# Patient Record
Sex: Male | Born: 1990
Health system: Southern US, Community
[De-identification: ages and names within clinical notes are randomized; demographics above are authoritative.]

---
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
## 2010-02-19 ENCOUNTER — Emergency Department (HOSPITAL_COMMUNITY)
Admission: EM | Admit: 2010-02-19 | Discharge: 2010-02-19 | Payer: Self-pay | Source: Home / Self Care | Admitting: Emergency Medicine
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
## 2012-05-25 ENCOUNTER — Emergency Department (HOSPITAL_COMMUNITY): Payer: No Typology Code available for payment source | Admitting: Anesthesiology

## 2012-05-25 ENCOUNTER — Encounter (HOSPITAL_COMMUNITY): Admission: EM | Disposition: A | Discharge status: Home / Self Care | Payer: Self-pay | Source: Home / Self Care

## 2012-05-25 ENCOUNTER — Encounter (HOSPITAL_COMMUNITY): Payer: Self-pay | Admitting: Physical Medicine and Rehabilitation

## 2012-05-25 ENCOUNTER — Inpatient Hospital Stay (HOSPITAL_COMMUNITY)
Admission: EM | Admit: 2012-05-25 | Discharge: 2012-05-30 | DRG: 330 | Disposition: A | Discharge status: Home / Self Care | Payer: No Typology Code available for payment source | Attending: General Surgery | Admitting: General Surgery

## 2012-05-25 ENCOUNTER — Emergency Department (HOSPITAL_COMMUNITY): Payer: No Typology Code available for payment source

## 2012-05-25 ENCOUNTER — Encounter (HOSPITAL_COMMUNITY): Payer: Self-pay | Admitting: Anesthesiology

## 2012-05-25 DIAGNOSIS — F17200 Nicotine dependence, unspecified, uncomplicated: Secondary | ICD-10-CM | POA: Diagnosis present

## 2012-05-25 DIAGNOSIS — F1210 Cannabis abuse, uncomplicated: Secondary | ICD-10-CM | POA: Diagnosis present

## 2012-05-25 DIAGNOSIS — S36499A Other injury of unspecified part of small intestine, initial encounter: Principal | ICD-10-CM | POA: Diagnosis present

## 2012-05-25 DIAGNOSIS — Y92410 Unspecified street and highway as the place of occurrence of the external cause: Secondary | ICD-10-CM

## 2012-05-25 DIAGNOSIS — S36409A Unspecified injury of unspecified part of small intestine, initial encounter: Secondary | ICD-10-CM

## 2012-05-25 DIAGNOSIS — K668 Other specified disorders of peritoneum: Secondary | ICD-10-CM

## 2012-05-25 DIAGNOSIS — D62 Acute posthemorrhagic anemia: Secondary | ICD-10-CM | POA: Diagnosis not present

## 2012-05-25 HISTORY — PX: LAPAROTOMY: SHX154

## 2012-05-25 LAB — URINALYSIS, ROUTINE W REFLEX MICROSCOPIC
Nitrite: NEGATIVE
Specific Gravity, Urine: 1.03 (ref 1.005–1.030)
Urobilinogen, UA: 0.2 mg/dL (ref 0.0–1.0)
pH: 5.5 (ref 5.0–8.0)

## 2012-05-25 LAB — CBC WITH DIFFERENTIAL/PLATELET
Basophils Absolute: 0 10*3/uL (ref 0.0–0.1)
Basophils Relative: 0 % (ref 0–1)
Eosinophils Absolute: 0 10*3/uL (ref 0.0–0.7)
Eosinophils Relative: 0 % (ref 0–5)
MCH: 30.8 pg (ref 26.0–34.0)
MCHC: 36.4 g/dL — ABNORMAL HIGH (ref 30.0–36.0)
MCV: 84.5 fL (ref 78.0–100.0)
Platelets: 216 10*3/uL (ref 150–400)
RDW: 12.3 % (ref 11.5–15.5)

## 2012-05-25 LAB — TYPE AND SCREEN
ABO/RH(D): A NEG
Antibody Screen: NEGATIVE

## 2012-05-25 LAB — URINE MICROSCOPIC-ADD ON

## 2012-05-25 LAB — COMPREHENSIVE METABOLIC PANEL
ALT: 16 U/L (ref 0–53)
Albumin: 4.9 g/dL (ref 3.5–5.2)
Calcium: 9.5 mg/dL (ref 8.4–10.5)
GFR calc Af Amer: >90 mL/min (ref >90)
Glucose, Bld: 112 mg/dL — ABNORMAL HIGH (ref 70–99)
Sodium: 137 mEq/L (ref 135–145)
Total Protein: 7.6 g/dL (ref 6.0–8.3)

## 2012-05-25 LAB — ABO/RH: ABO/RH(D): A NEG

## 2012-05-25 SURGERY — LAPAROTOMY, EXPLORATORY
Anesthesia: General | Site: Abdomen | Wound class: Clean Contaminated

## 2012-05-25 MED ORDER — DIPHENHYDRAMINE HCL 50 MG/ML IJ SOLN
12.5000 mg | Freq: Four times a day (QID) | INTRAMUSCULAR | Status: DC | PRN
  Filled 2012-05-25: qty 0.25

## 2012-05-25 MED ORDER — NEOSTIGMINE METHYLSULFATE 1 MG/ML IJ SOLN
INTRAMUSCULAR | Status: DC | PRN
  Administered 2012-05-25: 3 mg via INTRAVENOUS

## 2012-05-25 MED ORDER — NALOXONE HCL 0.4 MG/ML IJ SOLN
0.4000 mg | INTRAMUSCULAR | Status: DC | PRN
  Filled 2012-05-25: qty 1

## 2012-05-25 MED ORDER — SODIUM CHLORIDE 0.9 % IJ SOLN: 9.0000 mL | INTRAMUSCULAR | Status: DC | PRN

## 2012-05-25 MED ORDER — MIDAZOLAM HCL 5 MG/5ML IJ SOLN
INTRAMUSCULAR | Status: DC | PRN
  Administered 2012-05-25: 2 mg via INTRAVENOUS

## 2012-05-25 MED ORDER — 0.9 % SODIUM CHLORIDE (POUR BTL) OPTIME
TOPICAL | Status: DC | PRN
  Administered 2012-05-25: 3000 mL

## 2012-05-25 MED ORDER — SUFENTANIL CITRATE 50 MCG/ML IV SOLN
INTRAVENOUS | Status: DC | PRN
  Administered 2012-05-25: 20 ug via INTRAVENOUS
  Administered 2012-05-25: 10 ug via INTRAVENOUS

## 2012-05-25 MED ORDER — HYDROCODONE-ACETAMINOPHEN 5-325 MG PO TABS
2.0000 | ORAL_TABLET | Freq: Once | ORAL | Status: AC
  Administered 2012-05-25: 2 via ORAL
  Filled 2012-05-25: qty 2

## 2012-05-25 MED ORDER — POVIDONE-IODINE 10 % EX OINT
TOPICAL_OINTMENT | CUTANEOUS | Status: DC | PRN
  Administered 2012-05-25: 1 via TOPICAL

## 2012-05-25 MED ORDER — PROPOFOL 10 MG/ML IV BOLUS
INTRAVENOUS | Status: DC | PRN
  Administered 2012-05-25: 200 mg via INTRAVENOUS

## 2012-05-25 MED ORDER — PANTOPRAZOLE SODIUM 40 MG IV SOLR
40.0000 mg | INTRAVENOUS | Status: DC
  Administered 2012-05-26 – 2012-05-28 (×4): 40 mg via INTRAVENOUS
  Filled 2012-05-25 (×6): qty 40

## 2012-05-25 MED ORDER — ONDANSETRON HCL 4 MG/2ML IJ SOLN
4.0000 mg | Freq: Four times a day (QID) | INTRAMUSCULAR | Status: DC | PRN
  Filled 2012-05-25: qty 2

## 2012-05-25 MED ORDER — KCL IN DEXTROSE-NACL 20-5-0.45 MEQ/L-%-% IV SOLN
INTRAVENOUS | Status: AC
  Administered 2012-05-26: 1000 mL
  Filled 2012-05-25: qty 1000

## 2012-05-25 MED ORDER — IOHEXOL 300 MG/ML  SOLN
100.0000 mL | Freq: Once | INTRAMUSCULAR | Status: AC | PRN
  Administered 2012-05-25: 100 mL via INTRAVENOUS

## 2012-05-25 MED ORDER — DIPHENHYDRAMINE HCL 12.5 MG/5ML PO ELIX
12.5000 mg | ORAL_SOLUTION | Freq: Four times a day (QID) | ORAL | Status: DC | PRN
  Filled 2012-05-25: qty 5

## 2012-05-25 MED ORDER — ONDANSETRON HCL 4 MG/2ML IJ SOLN
4.0000 mg | Freq: Four times a day (QID) | INTRAMUSCULAR | Status: DC | PRN
  Administered 2012-05-26: 4 mg via INTRAVENOUS
  Filled 2012-05-25: qty 2

## 2012-05-25 MED ORDER — MORPHINE SULFATE (PF) 1 MG/ML IV SOLN
INTRAVENOUS | Status: AC
  Filled 2012-05-25: qty 25

## 2012-05-25 MED ORDER — IBUPROFEN 400 MG PO TABS
800.0000 mg | ORAL_TABLET | Freq: Once | ORAL | Status: AC
  Administered 2012-05-25: 800 mg via ORAL
  Filled 2012-05-25: qty 2

## 2012-05-25 MED ORDER — SODIUM CHLORIDE 0.9 % IV BOLUS (SEPSIS)
1000.0000 mL | Freq: Once | INTRAVENOUS | Status: AC
Start: 2012-05-25 — End: 2012-05-25
  Administered 2012-05-25: 1000 mL via INTRAVENOUS

## 2012-05-25 MED ORDER — ROCURONIUM BROMIDE 100 MG/10ML IV SOLN
INTRAVENOUS | Status: DC | PRN
  Administered 2012-05-25: 10 mg via INTRAVENOUS
  Administered 2012-05-25: 30 mg via INTRAVENOUS

## 2012-05-25 MED ORDER — OXYCODONE HCL 5 MG PO TABS: 5.0000 mg | ORAL_TABLET | Freq: Once | ORAL | Status: DC | PRN

## 2012-05-25 MED ORDER — ONDANSETRON HCL 4 MG PO TABS: 4.0000 mg | ORAL_TABLET | Freq: Four times a day (QID) | ORAL | Status: DC | PRN

## 2012-05-25 MED ORDER — LIDOCAINE HCL 4 % MT SOLN
OROMUCOSAL | Status: DC | PRN
  Administered 2012-05-25: 4 mL via TOPICAL

## 2012-05-25 MED ORDER — GLYCOPYRROLATE 0.2 MG/ML IJ SOLN
INTRAMUSCULAR | Status: DC | PRN
  Administered 2012-05-25: 0.4 mg via INTRAVENOUS

## 2012-05-25 MED ORDER — LIDOCAINE HCL (CARDIAC) 20 MG/ML IV SOLN
INTRAVENOUS | Status: DC | PRN
  Administered 2012-05-25: 100 mg via INTRAVENOUS

## 2012-05-25 MED ORDER — OXYCODONE HCL 5 MG/5ML PO SOLN: 5.0000 mg | Freq: Once | ORAL | Status: DC | PRN

## 2012-05-25 MED ORDER — LACTATED RINGERS IV SOLN
INTRAVENOUS | Status: DC | PRN
  Administered 2012-05-25 (×2): via INTRAVENOUS

## 2012-05-25 MED ORDER — SUCCINYLCHOLINE CHLORIDE 20 MG/ML IJ SOLN
INTRAMUSCULAR | Status: DC | PRN
  Administered 2012-05-25: 140 mg via INTRAVENOUS

## 2012-05-25 MED ORDER — ONDANSETRON HCL 4 MG/2ML IJ SOLN
INTRAMUSCULAR | Status: DC | PRN
  Administered 2012-05-25: 4 mg via INTRAVENOUS

## 2012-05-25 MED ORDER — HYDROMORPHONE HCL PF 1 MG/ML IJ SOLN: 0.2500 mg | INTRAMUSCULAR | Status: DC | PRN

## 2012-05-25 MED ORDER — POVIDONE-IODINE 10 % EX OINT
TOPICAL_OINTMENT | CUTANEOUS | Status: AC
  Filled 2012-05-25: qty 28.35

## 2012-05-25 MED ORDER — MORPHINE SULFATE (PF) 1 MG/ML IV SOLN
INTRAVENOUS | Status: DC
  Administered 2012-05-25: 23:00:00 via INTRAVENOUS
  Administered 2012-05-26: 9 mg via INTRAVENOUS
  Administered 2012-05-26: 04:00:00 via INTRAVENOUS
  Administered 2012-05-26: 20.94 mg via INTRAVENOUS
  Administered 2012-05-26: 15:00:00 via INTRAVENOUS
  Administered 2012-05-26: 14.56 mg via INTRAVENOUS
  Administered 2012-05-26: 10.5 mg via INTRAVENOUS
  Administered 2012-05-26: 19.5 mg via INTRAVENOUS
  Administered 2012-05-27: 4.5 mg via INTRAVENOUS
  Administered 2012-05-27 (×2): via INTRAVENOUS
  Administered 2012-05-27: 10.5 mg via INTRAVENOUS
  Administered 2012-05-27: 3.4 mg via INTRAVENOUS
  Administered 2012-05-27: 13.5 mg via INTRAVENOUS
  Administered 2012-05-28: 13:00:00 via INTRAVENOUS
  Administered 2012-05-28: 1.5 mg via INTRAVENOUS
  Administered 2012-05-28: 11.74 mg via INTRAVENOUS
  Administered 2012-05-28: 6 mg via INTRAVENOUS
  Administered 2012-05-28: 23:00:00 via INTRAVENOUS
  Administered 2012-05-28: 24 mg via INTRAVENOUS
  Administered 2012-05-29: 13.41 mg via INTRAVENOUS
  Filled 2012-05-25 (×8): qty 25

## 2012-05-25 MED ORDER — ONDANSETRON HCL 4 MG/2ML IJ SOLN
4.0000 mg | Freq: Once | INTRAMUSCULAR | Status: AC
  Administered 2012-05-25: 4 mg via INTRAVENOUS
  Filled 2012-05-25: qty 2

## 2012-05-25 MED ORDER — DEXAMETHASONE SODIUM PHOSPHATE 4 MG/ML IJ SOLN
INTRAMUSCULAR | Status: DC | PRN
  Administered 2012-05-25: 4 mg via INTRAVENOUS

## 2012-05-25 MED ORDER — METOCLOPRAMIDE HCL 5 MG/ML IJ SOLN: 10.0000 mg | Freq: Once | INTRAMUSCULAR | Status: DC | PRN

## 2012-05-25 MED ORDER — ENOXAPARIN SODIUM 40 MG/0.4ML ~~LOC~~ SOLN
40.0000 mg | SUBCUTANEOUS | Status: DC
  Administered 2012-05-26 – 2012-05-30 (×5): 40 mg via SUBCUTANEOUS
  Filled 2012-05-25 (×5): qty 0.4

## 2012-05-25 MED ORDER — SODIUM CHLORIDE 0.9 % IV SOLN
1.0000 g | Freq: Once | INTRAVENOUS | Status: AC
  Administered 2012-05-25: 1 g via INTRAVENOUS
  Filled 2012-05-25: qty 1

## 2012-05-25 MED ORDER — KCL IN DEXTROSE-NACL 20-5-0.9 MEQ/L-%-% IV SOLN
INTRAVENOUS | Status: DC
  Administered 2012-05-26 – 2012-05-29 (×7): via INTRAVENOUS
  Filled 2012-05-25 (×7): qty 1000

## 2012-05-25 SURGICAL SUPPLY — 43 items
BLADE SURG ROTATE 9660 (MISCELLANEOUS) ×1 IMPLANT
CANISTER SUCTION 2500CC (MISCELLANEOUS) ×2 IMPLANT
CHLORAPREP W/TINT 26ML (MISCELLANEOUS) ×2 IMPLANT
CLOTH BEACON ORANGE TIMEOUT ST (SAFETY) ×2 IMPLANT
COVER SURGICAL LIGHT HANDLE (MISCELLANEOUS) ×2 IMPLANT
DRAPE LAPAROSCOPIC ABDOMINAL (DRAPES) ×2 IMPLANT
DRAPE UTILITY 15X26 W/TAPE STR (DRAPE) ×4 IMPLANT
DRAPE WARM FLUID 44X44 (DRAPE) ×2 IMPLANT
ELECT BLADE 6.5 EXT (BLADE) ×1 IMPLANT
ELECT CAUTERY BLADE 6.4 (BLADE) ×2 IMPLANT
ELECT REM PT RETURN 9FT ADLT (ELECTROSURGICAL) ×2
ELECTRODE REM PT RTRN 9FT ADLT (ELECTROSURGICAL) ×1 IMPLANT
GLOVE BIO SURGEON STRL SZ7.5 (GLOVE) ×2 IMPLANT
GLOVE BIOGEL PI IND STRL 7.0 (GLOVE) IMPLANT
GLOVE BIOGEL PI INDICATOR 7.0 (GLOVE) ×1
GLOVE SURG SS PI 7.0 STRL IVOR (GLOVE) ×1 IMPLANT
GOWN STRL NON-REIN LRG LVL3 (GOWN DISPOSABLE) ×6 IMPLANT
KIT BASIN OR (CUSTOM PROCEDURE TRAY) ×2 IMPLANT
KIT ROOM TURNOVER OR (KITS) ×2 IMPLANT
LIGASURE IMPACT 36 18CM CVD LR (INSTRUMENTS) IMPLANT
NS IRRIG 1000ML POUR BTL (IV SOLUTION) ×4 IMPLANT
PACK GENERAL/GYN (CUSTOM PROCEDURE TRAY) ×2 IMPLANT
PAD ARMBOARD 7.5X6 YLW CONV (MISCELLANEOUS) ×2 IMPLANT
RELOAD PROXIMATE 75MM BLUE (ENDOMECHANICALS) ×2 IMPLANT
RELOAD STAPLE 75 3.8 BLU REG (ENDOMECHANICALS) IMPLANT
SPECIMEN JAR LARGE (MISCELLANEOUS) ×1 IMPLANT
SPONGE GAUZE 4X4 12PLY (GAUZE/BANDAGES/DRESSINGS) ×1 IMPLANT
SPONGE LAP 18X18 X RAY DECT (DISPOSABLE) IMPLANT
STAPLER GUN LINEAR PROX 60 (STAPLE) ×1 IMPLANT
STAPLER PROXIMATE 75MM BLUE (STAPLE) ×1 IMPLANT
STAPLER VISISTAT 35W (STAPLE) ×2 IMPLANT
SUCTION POOLE TIP (SUCTIONS) ×2 IMPLANT
SUT PDS AB 1 TP1 96 (SUTURE) ×4 IMPLANT
SUT SILK 2 0 SH CR/8 (SUTURE) ×2 IMPLANT
SUT SILK 2 0 TIES 10X30 (SUTURE) ×2 IMPLANT
SUT SILK 3 0 SH CR/8 (SUTURE) ×2 IMPLANT
SUT SILK 3 0 TIES 10X30 (SUTURE) ×2 IMPLANT
SUT VIC AB 3-0 SH 18 (SUTURE) IMPLANT
TAPE CLOTH SURG 6X10 WHT LF (GAUZE/BANDAGES/DRESSINGS) ×1 IMPLANT
TOWEL OR 17X26 10 PK STRL BLUE (TOWEL DISPOSABLE) ×2 IMPLANT
TRAY FOLEY CATH 14FRSI W/METER (CATHETERS) ×1 IMPLANT
WATER STERILE IRR 1000ML POUR (IV SOLUTION) IMPLANT
YANKAUER SUCT BULB TIP NO VENT (SUCTIONS) ×1 IMPLANT
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
## 2012-05-25 NOTE — ED Notes (Signed)
Pt denies n/v/d 

## 2012-05-25 NOTE — ED Notes (Signed)
Pelvis stable, pt ambulatory
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
## 2012-05-25 NOTE — Transfer of Care (Signed)
Immediate Anesthesia Transfer of Care Note  Patient: Barry Hines  Procedure(s) Performed: Procedure(s): EXPLORATORY LAPAROTOMY (N/A)  Patient Location: PACU  Anesthesia Type:General  Level of Consciousness: sedated, patient cooperative and responds to stimulation  Airway & Oxygen Therapy: Patient Spontanous Breathing and Patient connected to nasal cannula oxygen  Post-op Assessment: Report given to PACU RN, Post -op Vital signs reviewed and stable and Patient moving all extremities X 4  Post vital signs: Reviewed and stable  Complications: No apparent anesthesia complications

## 2012-05-25 NOTE — ED Notes (Signed)
Patient transported to X-ray 

## 2012-05-25 NOTE — Anesthesia Preprocedure Evaluation (Signed)
Anesthesia Evaluation  Patient identified by MRN, date of birth, ID band Patient awake    Reviewed: Allergy & Precautions, H&P , NPO status , Patient's Chart, lab work & pertinent test results, reviewed documented beta blocker date and time   Airway Mallampati: II TM Distance: >3 FB Neck ROM: full    Dental   Pulmonary Current Smoker,  breath sounds clear to auscultation        Cardiovascular negative cardio ROS  Rhythm:regular     Neuro/Psych negative neurological ROS  negative psych ROS   GI/Hepatic negative GI ROS, Neg liver ROS,   Endo/Other  negative endocrine ROS  Renal/GU negative Renal ROS  negative genitourinary   Musculoskeletal   Abdominal   Peds  Hematology negative hematology ROS (+)   Anesthesia Other Findings See surgeon's H&P   Reproductive/Obstetrics negative OB ROS                           Anesthesia Physical Anesthesia Plan  ASA: II and emergent  Anesthesia Plan: General   Post-op Pain Management:    Induction: Intravenous, Rapid sequence and Cricoid pressure planned  Airway Management Planned: Oral ETT  Additional Equipment:   Intra-op Plan:   Post-operative Plan: Extubation in OR  Informed Consent: I have reviewed the patients History and Physical, chart, labs and discussed the procedure including the risks, benefits and alternatives for the proposed anesthesia with the patient or authorized representative who has indicated his/her understanding and acceptance.   Dental Advisory Given  Plan Discussed with: CRNA and Surgeon  Anesthesia Plan Comments:         Anesthesia Quick Evaluation  

## 2012-05-25 NOTE — ED Notes (Signed)
Pt diaphoretic & c/o LLQ & RLQ abd pain, Rancour, MD notified & at bedside
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
## 2012-05-25 NOTE — Anesthesia Postprocedure Evaluation (Signed)
Anesthesia Post Note  Patient: Barry Hines  Procedure(s) Performed: Procedure(s) (LRB): EXPLORATORY LAPAROTOMY (N/A)  Anesthesia type: general  Patient location: PACU  Post pain: Pain level controlled  Post assessment: Patient's Cardiovascular Status Stable  Last Vitals:  Filed Vitals:   05/25/12 2241  BP: 129/68  Pulse: 81  Temp: 36.6 C  Resp: 14    Post vital signs: Reviewed and stable  Level of consciousness: sedated  Complications: No apparent anesthesia complications

## 2012-05-25 NOTE — ED Notes (Signed)
Carolynne Edouard, MD at bedside
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
## 2012-05-25 NOTE — ED Notes (Signed)
Family at bedside. 
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
## 2012-05-26 LAB — BASIC METABOLIC PANEL
BUN: 9 mg/dL (ref 6–23)
Chloride: 102 mEq/L (ref 96–112)
GFR calc Af Amer: >90 mL/min (ref >90)
Glucose, Bld: 172 mg/dL — ABNORMAL HIGH (ref 70–99)
Potassium: 4.1 mEq/L (ref 3.5–5.1)

## 2012-05-26 LAB — CBC
HCT: 41.1 % (ref 39.0–52.0)
Hemoglobin: 14.4 g/dL (ref 13.0–17.0)
RBC: 4.86 MIL/uL (ref 4.22–5.81)
RDW: 12.4 % (ref 11.5–15.5)
WBC: 20.6 10*3/uL — ABNORMAL HIGH (ref 4.0–10.5)

## 2012-05-26 NOTE — ED Provider Notes (Signed)
Medical screening examination/treatment/procedure(s) were performed by non-physician practitioner and as supervising physician I was immediately available for consultation/collaboration.    Mayme Profeta R Tynlee Bayle, MD 05/26/12 0703 
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
## 2012-05-27 DIAGNOSIS — D62 Acute posthemorrhagic anemia: Secondary | ICD-10-CM | POA: Diagnosis not present

## 2012-05-27 DIAGNOSIS — S36409A Unspecified injury of unspecified part of small intestine, initial encounter: Secondary | ICD-10-CM

## 2012-05-27 LAB — CBC WITH DIFFERENTIAL/PLATELET
Basophils Relative: 0 % (ref 0–1)
Eosinophils Absolute: 0.1 10*3/uL (ref 0.0–0.7)
Eosinophils Relative: 1 % (ref 0–5)
HCT: 36.4 % — ABNORMAL LOW (ref 39.0–52.0)
Hemoglobin: 12.5 g/dL — ABNORMAL LOW (ref 13.0–17.0)
Lymphs Abs: 1.5 10*3/uL (ref 0.7–4.0)
MCH: 29.6 pg (ref 26.0–34.0)
MCHC: 34.3 g/dL (ref 30.0–36.0)
MCV: 86.1 fL (ref 78.0–100.0)
Monocytes Absolute: 0.8 10*3/uL (ref 0.1–1.0)
Monocytes Relative: 8 % (ref 3–12)
Neutrophils Relative %: 75 % (ref 43–77)
RBC: 4.23 MIL/uL (ref 4.22–5.81)

## 2012-05-27 LAB — BASIC METABOLIC PANEL
BUN: 9 mg/dL (ref 6–23)
CO2: 29 mEq/L (ref 19–32)
GFR calc non Af Amer: >90 mL/min (ref >90)
Glucose, Bld: 103 mg/dL — ABNORMAL HIGH (ref 70–99)
Potassium: 3.9 mEq/L (ref 3.5–5.1)
Sodium: 136 mEq/L (ref 135–145)
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
## 2012-05-27 NOTE — Progress Notes (Signed)
UR completed 
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
## 2012-05-28 ENCOUNTER — Encounter (HOSPITAL_COMMUNITY): Payer: Self-pay | Admitting: General Surgery

## 2012-05-28 LAB — CBC
MCH: 29.6 pg (ref 26.0–34.0)
MCHC: 35 g/dL (ref 30.0–36.0)
MCV: 84.6 fL (ref 78.0–100.0)
Platelets: 174 10*3/uL (ref 150–400)
RBC: 4.29 MIL/uL (ref 4.22–5.81)
RDW: 12.2 % (ref 11.5–15.5)

## 2012-05-28 NOTE — Progress Notes (Signed)
Tolerating clears, active BS, no flatus yet. Patient examined and I agree with the assessment and plan  Violeta Gelinas, MD, MPH, FACS Pager: [phone]  05/28/2012 1:55 PM
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
## 2012-05-29 MED ORDER — MORPHINE SULFATE 2 MG/ML IJ SOLN: 2.0000 mg | INTRAMUSCULAR | Status: DC | PRN

## 2012-05-29 MED ORDER — HYDROCODONE-ACETAMINOPHEN 10-325 MG PO TABS
0.5000 | ORAL_TABLET | ORAL | Status: DC | PRN
  Administered 2012-05-29: 2 via ORAL
  Filled 2012-05-29 (×2): qty 2
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
## 2012-05-29 NOTE — Progress Notes (Signed)
Lots of flatus after lunch.  Incision CDI. Patient examined and I agree with the assessment and plan  Violeta Gelinas, MD, MPH, FACS Pager: [phone]  05/29/2012 2:27 PM

## 2012-05-30 MED ORDER — HYDROCODONE-ACETAMINOPHEN 5-325 MG PO TABS
1.0000 | ORAL_TABLET | ORAL | Status: DC | PRN
Start: 2012-05-30 — End: 2022-10-21

## 2012-05-30 NOTE — Discharge Summary (Signed)
Barry Yeh, MD, MPH, FACS Pager: [phone]  

## 2012-05-30 NOTE — Progress Notes (Signed)
Eagerly anticipating lunch (BLT), inciison CDI, has taken no pain meds today Patient examined and I agree with the assessment and plan  Violeta Gelinas, MD, MPH, FACS Pager: [phone]  05/30/2012 12:33 PM
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
## 2012-05-30 NOTE — Progress Notes (Signed)
Discharge instructions given and reviewed with patient. Family member will be here soon to pick up patient. Patient will be discharged home with family.
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
## 2012-06-07 ENCOUNTER — Ambulatory Visit (INDEPENDENT_AMBULATORY_CARE_PROVIDER_SITE_OTHER): Payer: Self-pay | Admitting: Internal Medicine

## 2012-06-07 ENCOUNTER — Encounter (INDEPENDENT_AMBULATORY_CARE_PROVIDER_SITE_OTHER): Payer: Self-pay

## 2012-06-07 DIAGNOSIS — S36439D Laceration of unspecified part of small intestine, subsequent encounter: Secondary | ICD-10-CM

## 2012-06-07 DIAGNOSIS — Z5189 Encounter for other specified aftercare: Secondary | ICD-10-CM

## 2012-06-07 NOTE — Patient Instructions (Signed)
ok to shower, lifting restrictions until 5/25/14th. Will follow up prn, call with questions.
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
## 2012-07-01 ENCOUNTER — Ambulatory Visit (INDEPENDENT_AMBULATORY_CARE_PROVIDER_SITE_OTHER): Payer: Self-pay | Admitting: Surgery
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
## 2019-10-02 ENCOUNTER — Ambulatory Visit: Payer: Self-pay | Attending: Internal Medicine

## 2019-10-02 DIAGNOSIS — Z23 Encounter for immunization: Secondary | ICD-10-CM

## 2019-10-02 NOTE — Progress Notes (Signed)
   Covid-19 Vaccination Clinic  Name:  Barry Hines    MRN: [id number] DOB: 11-19-1990  10/02/2019  [name] was observed post Covid-19 immunization for 15 minutes without incident. He was provided with Vaccine Information Sheet and instruction to access the V-Safe system.   [name] was instructed to call 911 with any severe reactions post vaccine: Marland Kitchen Difficulty breathing  . Swelling of face and throat  . A fast heartbeat  . A bad rash all over body  . Dizziness and weakness   Immunizations Administered    Name Date Dose VIS Date Route   Pfizer COVID-19 Vaccine 10/02/2019 11:01 AM 0.3 mL 04/09/2018 Intramuscular   Manufacturer: [employer], [employer]   Lot: [id number]   NDC: [address]-7

## 2019-10-23 ENCOUNTER — Ambulatory Visit: Payer: Self-pay

## 2020-03-11 ENCOUNTER — Encounter (HOSPITAL_COMMUNITY): Payer: Self-pay | Admitting: Psychiatry

## 2020-03-11 ENCOUNTER — Ambulatory Visit (INDEPENDENT_AMBULATORY_CARE_PROVIDER_SITE_OTHER): Payer: No Payment, Other | Admitting: Clinical

## 2020-03-11 ENCOUNTER — Telehealth (INDEPENDENT_AMBULATORY_CARE_PROVIDER_SITE_OTHER): Payer: No Payment, Other | Admitting: Psychiatry

## 2020-03-11 ENCOUNTER — Other Ambulatory Visit (HOSPITAL_COMMUNITY): Payer: Self-pay | Admitting: Psychiatry

## 2020-03-11 ENCOUNTER — Other Ambulatory Visit: Payer: Self-pay

## 2020-03-11 DIAGNOSIS — F900 Attention-deficit hyperactivity disorder, predominantly inattentive type: Secondary | ICD-10-CM | POA: Diagnosis not present

## 2020-03-11 DIAGNOSIS — F330 Major depressive disorder, recurrent, mild: Secondary | ICD-10-CM | POA: Diagnosis not present

## 2020-03-11 DIAGNOSIS — F1220 Cannabis dependence, uncomplicated: Secondary | ICD-10-CM

## 2020-03-11 MED ORDER — ATOMOXETINE HCL 40 MG PO CAPS: 40.0000 mg | ORAL_CAPSULE | Freq: Every day | ORAL | 2 refills | Status: DC

## 2020-03-11 MED FILL — ATOMOXETINE HCL 40 MG CAPS: 40 | 30 days supply | Qty: 30 | Fill #0

## 2020-03-11 NOTE — Progress Notes (Signed)
Psychiatric Initial Adult Assessment  Virtual Visit via Video Note  I connected with Barry Hines on 03/11/20 at  2:00 PM EST by a video enabled telemedicine application and verified that I am speaking with the correct person using two identifiers.  Location: Patient: Home Provider: Clinic   I discussed the limitations of evaluation and management by telemedicine and the availability of in person appointments. The patient expressed understanding and agreed to proceed.  I provided 30  minutes of non-face-to-face time during this encounter.     Patient Identification: Barry Hines MRN:  [id number] Date of Evaluation:  03/11/2020 Referral Source: Walk in Chief Complaint:  "I need help with medication management" Visit Diagnosis:    ICD-10-CM   1. Attention deficit hyperactivity disorder (ADHD), predominantly inattentive type  F90.0 atomoxetine (STRATTERA) 40 MG capsule    History of Present Illness:  30 year old male seen today for initial psychiatric evaluation. He has a psychiatric history of ADHD and depression. He notes that he has not taken any medications since he was around the age of 51. In the past her notes that he has tried Ritalin, Concerta, and Adderall.   Today he is pleasant, cooperative, and engaged in conversation. He informed provider that he has minimal anxiety and depression however has difficulty concentrating. A GAD 7 was conducted by patients therapist today and patient scored a 2. A PHQ 9 was also conducted and patient scored a 3. Patient notes that he was anxious and depressed a few months ago because he had no job and was facing being homeless. He notes he now works  At [employer] and feels more secure and mentally stable.  Patient endorses symptoms of ADHD such as poor concentration, disorganization, forgetfulness, poor listening skills and avoidance of mentally taxing task. He also notes that he smokes marijuana 3-5 times daily. Patient  notes that his mood is overall stable but does endorse distractibility, racing thoughts, and impulsive spending on food and video games. He denies SI/HI/VAH or paranoia.  Today he is agreeable to start Strattera 40 mg to help manage symptoms of ADHD. Potential side effects of medication and risks vs benefits of treatment vs non-treatment were explained and discussed. All questions were answered. He will follow up with outpatient counseling for therapy. No other concerns noted at this time.  Associated Signs/Symptoms: Depression Symptoms:  difficulty concentrating, (Hypo) Manic Symptoms:  Distractibility, Flight of Ideas, Licensed conveyancer, Impulsivity, Anxiety Symptoms:  Denies Psychotic Symptoms:  Denies PTSD Symptoms: NA  Past Psychiatric History: Depression and ADHD  Previous Psychotropic Medications: Ritalin, concerta, and adderall  Substance Abuse History in the last 12 months:  Yes.    Consequences of Substance Abuse: NA  Past Medical History: History reviewed. No pertinent past medical history.  Past Surgical History:  Procedure Laterality Date  . LAPAROTOMY N/A 05/25/2012   Procedure: EXPLORATORY LAPAROTOMY;  Surgeon: Robyne Askew, MD;  Location: [facility] OR;  Service: General;  Laterality: N/A;    Family Psychiatric History: Mother depression   Family History: History reviewed. No pertinent family history.  Social History:   Social History   Socioeconomic History  . Marital status: Single    Spouse name: Not on file  . Number of children: Not on file  . Years of education: Not on file  . Highest education level: Not on file  Occupational History  . Not on file  Tobacco Use  . Smoking status: Current Every Day Smoker    Types: Cigarettes  .  Smokeless tobacco: Not on file  Substance and Sexual Activity  . Alcohol use: No  . Drug use: Yes    Types: Marijuana  . Sexual activity: Not on file  Other Topics Concern  . Not on file  Social History Narrative   . Not on file   Social Determinants of Health   Financial Resource Strain: Not on file  Food Insecurity: Not on file  Transportation Needs: Not on file  Physical Activity: Not on file  Stress: Not on file  Social Connections: Not on file    Additional Social History: Patient resides in [place]. He is single and has an 39 year old son. He denies alcohol and tobacco use. He endorses smoking marijuana daily. He works at [employer].   Allergies:  No Known Allergies  Metabolic Disorder Labs: No results found for: HGBA1C, MPG No results found for: PROLACTIN No results found for: CHOL, TRIG, HDL, CHOLHDL, VLDL, LDLCALC No results found for: TSH  Therapeutic Level Labs: No results found for: LITHIUM No results found for: CBMZ No results found for: VALPROATE  Current Medications: Current Outpatient Medications  Medication Sig Dispense Refill  . atomoxetine (STRATTERA) 40 MG capsule Take 1 capsule (40 mg total) by mouth daily. 30 capsule 2  . HYDROcodone-acetaminophen (NORCO) 5-325 MG per tablet Take 1-2 tablets by mouth every 4 (four) hours as needed for pain. 10 tablet 0   No current facility-administered medications for this visit.    Musculoskeletal: Strength & Muscle Tone: Unable to assess due to telehealth visit Gait & Station: Unable to assess due to telehealth visit Patient leans: N/A  Psychiatric Specialty Exam: Review of Systems  There were no vitals taken for this visit.There is no height or weight on file to calculate BMI.  General Appearance: Well Groomed  Eye Contact:  Good  Speech:  Clear and Coherent and Normal Rate  Volume:  Normal  Mood:  Euthymic  Affect:  Appropriate and Congruent  Thought Process:  Coherent, Goal Directed and Linear  Orientation:  Full (Time, Place, and Person)  Thought Content:  WDL and Logical  Suicidal Thoughts:  No  Homicidal Thoughts:  No  Memory:  Immediate;   Fair Recent;   Good Remote;   Good  Judgement:  Good   Insight:  Good  Psychomotor Activity:  Normal  Concentration:  Concentration: Fair and Attention Span: Fair  Recall:  Good  Fund of Knowledge:Good  Language: Good  Akathisia:  No  Handed:  Right  AIMS (if indicated):  Not done  Assets:  Communication Skills Desire for Improvement Financial Resources/Insurance Housing Social Support  ADL's:  Intact  Cognition: WNL  Sleep:  Good   Screenings: GAD-7   Advertising copywriter from 03/11/2020 in [facility]  Total GAD-7 Score 2    PHQ2-9   Flowsheet Row Counselor from 03/11/2020 in [facility]  PHQ-2 Total Score 2  PHQ-9 Total Score 3      Assessment and Plan: Patient endorses symptoms of ADHD. Today he is agreeable to start Strattera 40 mg to help manage symptoms of ADHD.  1. Attention deficit hyperactivity disorder (ADHD), predominantly inattentive type  Start- atomoxetine (STRATTERA) 40 MG capsule; Take 1 capsule (40 mg total) by mouth daily.  Dispense: 30 capsule; Refill: 2  Follow up in 3 months Follow up with therapy  Shanna Cisco, NP 1/27/20222:23 PM

## 2020-03-13 DIAGNOSIS — F331 Major depressive disorder, recurrent, moderate: Secondary | ICD-10-CM | POA: Insufficient documentation

## 2020-03-13 DIAGNOSIS — F1220 Cannabis dependence, uncomplicated: Secondary | ICD-10-CM | POA: Insufficient documentation

## 2020-03-13 DIAGNOSIS — F330 Major depressive disorder, recurrent, mild: Secondary | ICD-10-CM | POA: Insufficient documentation
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
## 2020-05-03 ENCOUNTER — Ambulatory Visit (HOSPITAL_COMMUNITY): Payer: No Payment, Other | Admitting: Clinical

## 2020-05-03 ENCOUNTER — Other Ambulatory Visit: Payer: Self-pay

## 2020-05-18 ENCOUNTER — Ambulatory Visit (HOSPITAL_COMMUNITY): Payer: No Payment, Other | Admitting: Clinical

## 2020-05-18 ENCOUNTER — Other Ambulatory Visit: Payer: Self-pay

## 2020-05-18 ENCOUNTER — Telehealth (HOSPITAL_COMMUNITY): Payer: Self-pay | Admitting: Clinical

## 2020-06-09 ENCOUNTER — Other Ambulatory Visit: Payer: Self-pay

## 2020-06-09 ENCOUNTER — Telehealth (HOSPITAL_COMMUNITY): Payer: No Payment, Other | Admitting: Psychiatry

## 2022-10-21 ENCOUNTER — Ambulatory Visit (HOSPITAL_COMMUNITY)
Admission: EM | Admit: 2022-10-21 | Discharge: 2022-10-21 | Disposition: A | Discharge status: Home / Self Care | Payer: Medicaid Other | Attending: Urgent Care | Admitting: Urgent Care

## 2022-10-21 ENCOUNTER — Encounter (HOSPITAL_COMMUNITY): Payer: Self-pay

## 2022-10-21 DIAGNOSIS — M5442 Lumbago with sciatica, left side: Secondary | ICD-10-CM | POA: Diagnosis not present

## 2022-10-21 MED ORDER — KETOROLAC TROMETHAMINE 30 MG/ML IJ SOLN
30.0000 mg | Freq: Once | INTRAMUSCULAR | Status: AC
  Administered 2022-10-21: 30 mg via INTRAMUSCULAR

## 2022-10-21 MED ORDER — KETOROLAC TROMETHAMINE 30 MG/ML IJ SOLN
INTRAMUSCULAR | Status: AC
  Filled 2022-10-21: qty 1

## 2022-10-21 MED ORDER — METHOCARBAMOL 750 MG PO TABS: ORAL_TABLET | ORAL | 0 refills | Status: AC

## 2022-10-21 MED ORDER — PREDNISONE 20 MG PO TABS: 40.0000 mg | ORAL_TABLET | Freq: Every day | ORAL | 0 refills | Status: AC
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
## 2022-10-21 NOTE — ED Triage Notes (Signed)
3 day history of lower back and right leg pain. Pt was moving furniture and injured his lower back.

## 2022-10-21 NOTE — Discharge Instructions (Signed)
You have musculoskeletal back pain causing inflammation and nerve root irritation.  Please apply a warm moist compress, such as a microwavable heating pack, to your neck several times daily. You may consider alternating with an ice pack as well.  Take the muscle relaxer three to four times daily as needed. Keep in mind it may make you feel tired or drowsy, so do not operate machinery or drive a car until you know how it affects you.  Please take the prednisone daily as prescribed - best taken with food in the mornings.  Try to stay hydrated with WATER as dehydration and caffeine intake can worsen this condition.  If you develop worsening pain, changes in bowel or bladder habits, numbness/ tingling on the inner thighs, or fever, please head to the ER.
